# Patient Record
Sex: Male | Born: 1955 | Race: White | Hispanic: No | Marital: Married
Health system: Southern US, Community
[De-identification: ages and names within clinical notes are randomized; demographics above are authoritative.]

---

## 2007-01-26 ENCOUNTER — Encounter: Admission: RE | Admit: 2007-01-26 | Discharge: 2007-01-26 | Payer: Self-pay | Admitting: Family Medicine

## 2008-08-28 IMAGING — CR DG LUMBAR SPINE COMPLETE 4+V
5 series · 5 of 5 positions shown · non-contrast
Comparison: none

CLINICAL DATA: Pre-employment.  Asymptomatic.
DIAGNOSTIC LUMBAR SPINE COMPLETE - 4 VIEW: 
No comparison.

[t l-spine a.p.]
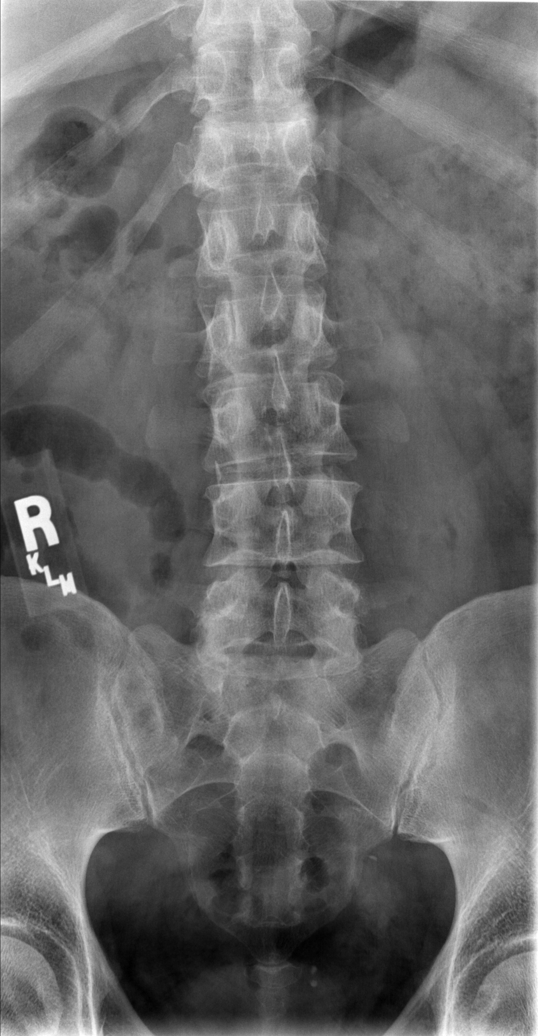

[t l-spine oblique exposure (1 of 2)]
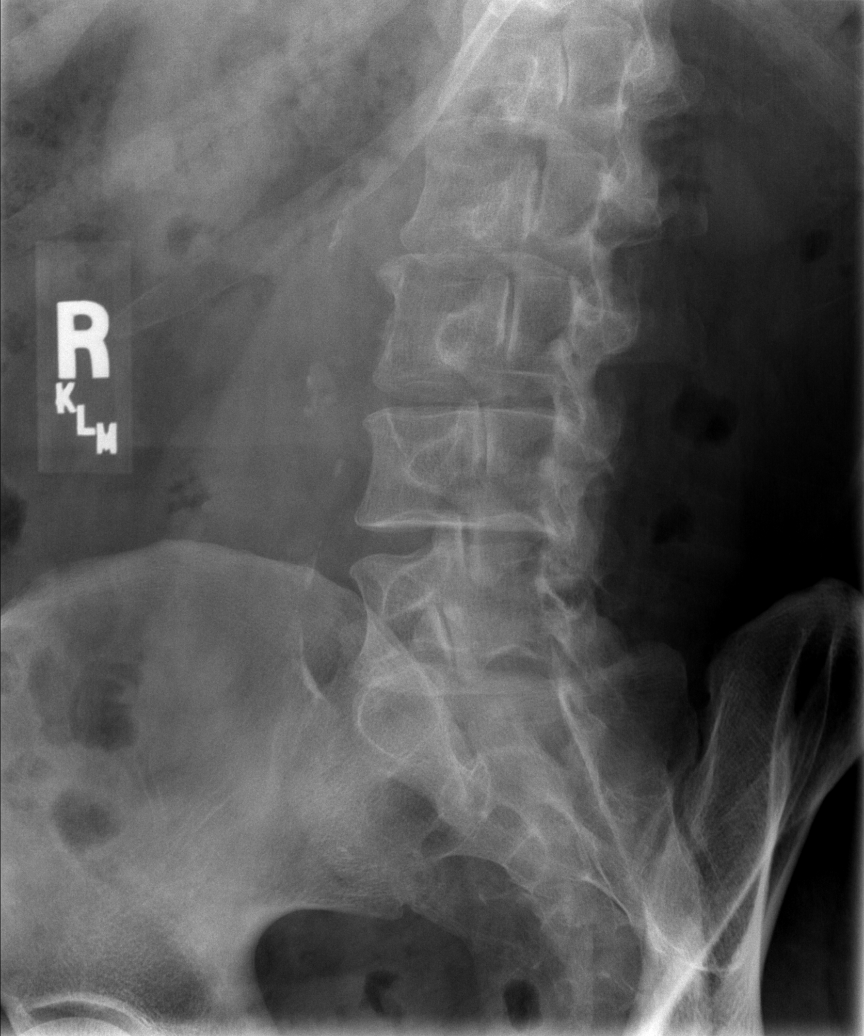

[t l-spine oblique exposure (2 of 2)]
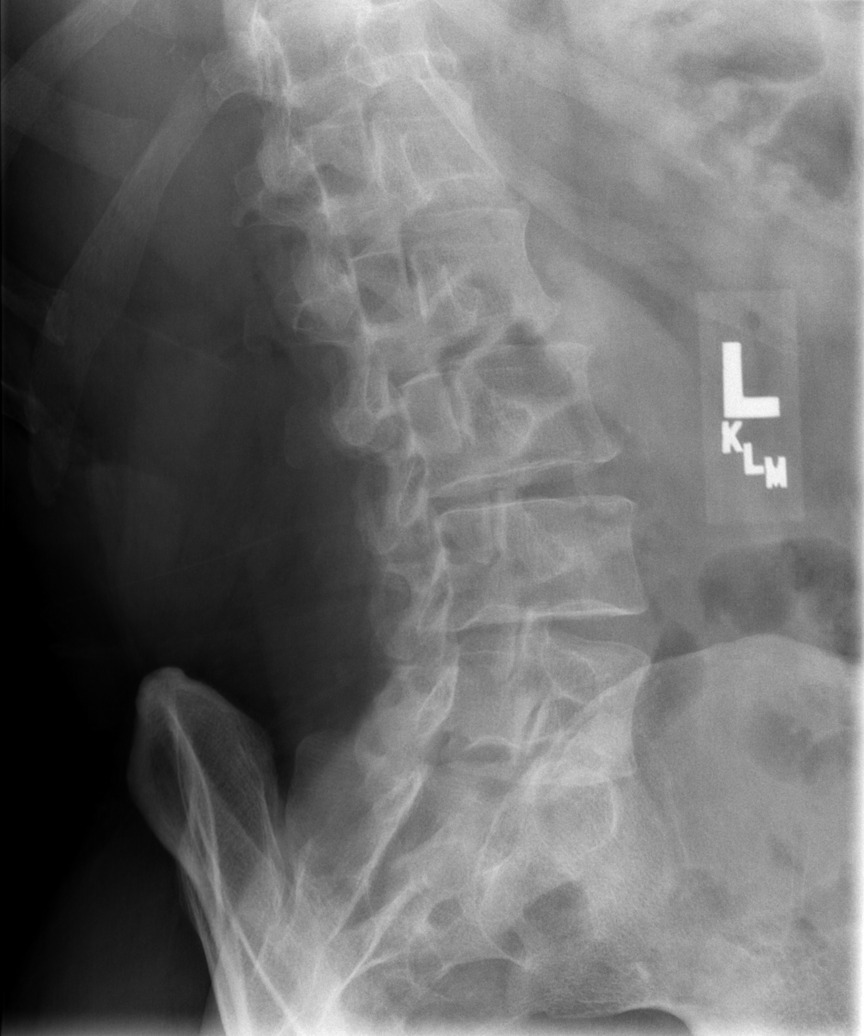

[t l-spine lat]
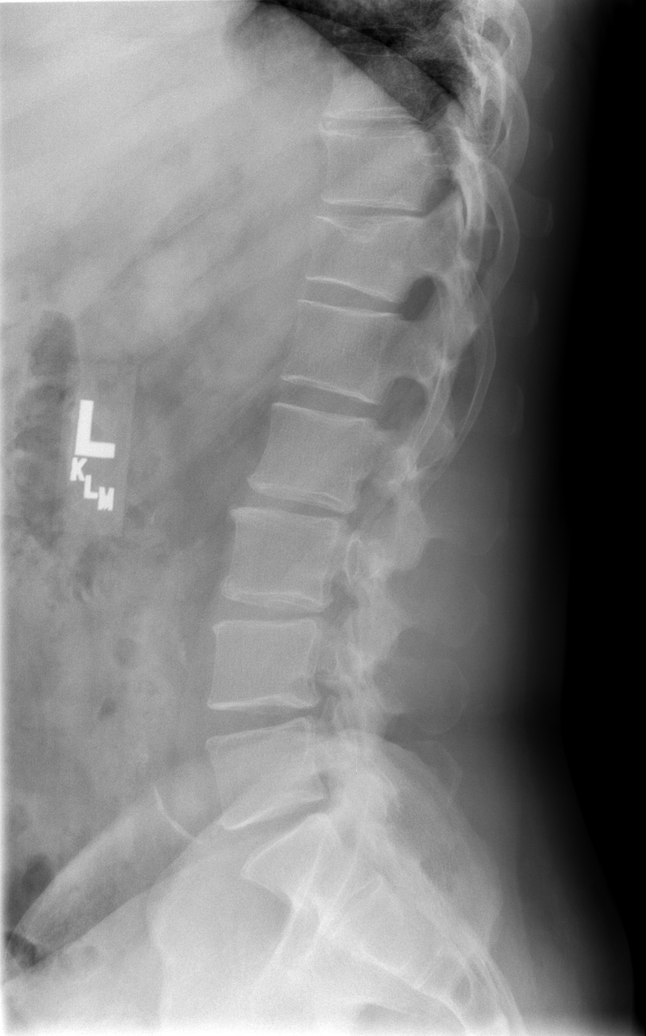

[t l-spine l5-s1 spot]
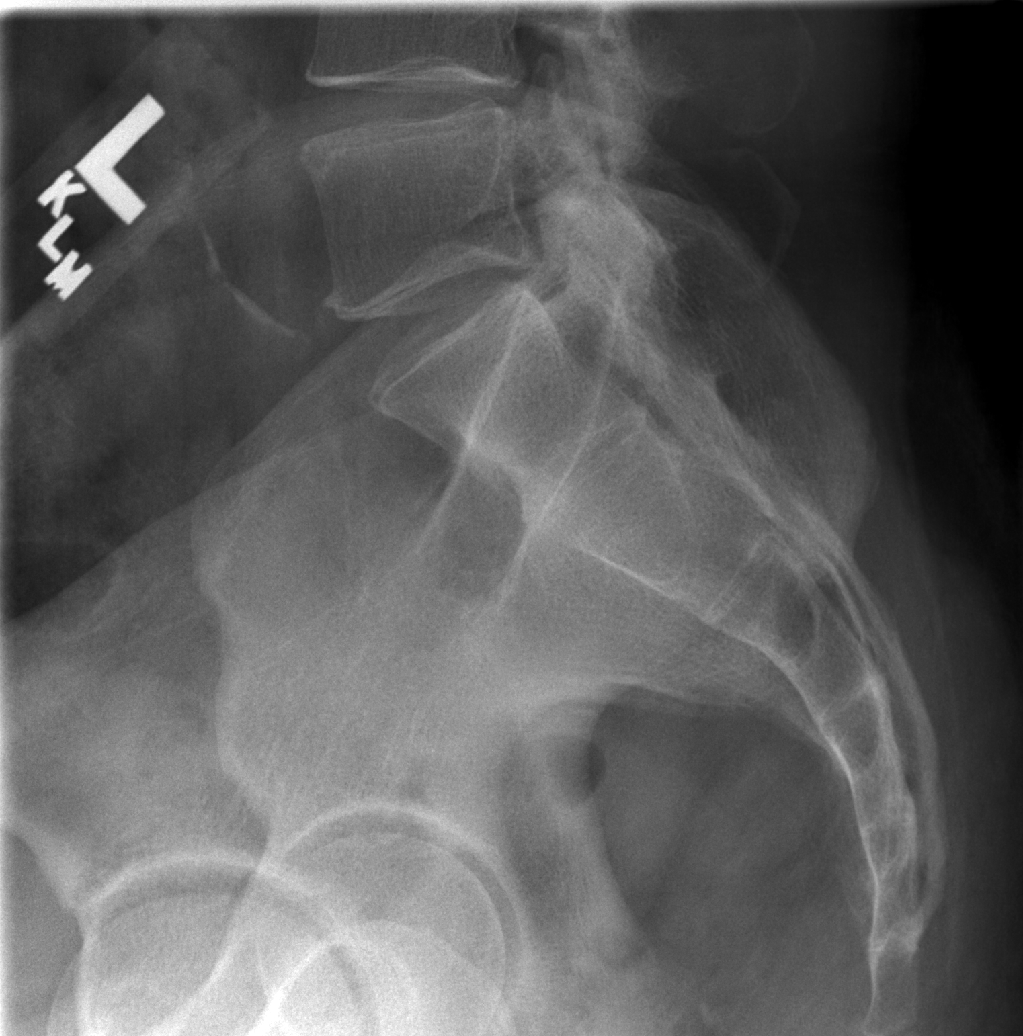

[5 of 5 positions shown; findings below may reference images not displayed]

FINDINGS: Five non-rib bearing lumbar vertebra are noted.   Chronic appearing slight wedging T12 with superior endplate Schmorl?s node noted.  Slight degenerative disk space narrowing and anterior spondylosis noted at T10-T11 with slight degenerative disk disease at L2-3.  The remaining disk spaces and posterior vertebral alignment are normally maintained.  Slight atheromatous vascular calcification noted.
IMPRESSION: 1.   Slight degenerative changes T10-T11 and L2-3. 
2.  Slight vascular calcification.
3.  Otherwise no significant abnormality.

## 2013-12-06 ENCOUNTER — Other Ambulatory Visit: Payer: Self-pay | Admitting: Family Medicine

## 2013-12-06 DIAGNOSIS — N644 Mastodynia: Secondary | ICD-10-CM

## 2013-12-13 ENCOUNTER — Encounter (INDEPENDENT_AMBULATORY_CARE_PROVIDER_SITE_OTHER): Payer: Self-pay

## 2013-12-13 ENCOUNTER — Ambulatory Visit
Admission: RE | Admit: 2013-12-13 | Discharge: 2013-12-13 | Disposition: A | Discharge status: Home / Self Care | Payer: Self-pay | Source: Ambulatory Visit | Attending: Family Medicine | Admitting: Family Medicine

## 2013-12-13 ENCOUNTER — Other Ambulatory Visit: Payer: Self-pay

## 2013-12-13 DIAGNOSIS — N644 Mastodynia: Secondary | ICD-10-CM

## 2015-08-21 DIAGNOSIS — K219 Gastro-esophageal reflux disease without esophagitis: Secondary | ICD-10-CM | POA: Diagnosis not present

## 2015-08-21 DIAGNOSIS — Z6835 Body mass index (BMI) 35.0-35.9, adult: Secondary | ICD-10-CM | POA: Diagnosis not present

## 2015-08-21 DIAGNOSIS — J301 Allergic rhinitis due to pollen: Secondary | ICD-10-CM | POA: Diagnosis not present

## 2015-08-21 DIAGNOSIS — G4733 Obstructive sleep apnea (adult) (pediatric): Secondary | ICD-10-CM | POA: Diagnosis not present

## 2015-10-16 DIAGNOSIS — R0989 Other specified symptoms and signs involving the circulatory and respiratory systems: Secondary | ICD-10-CM | POA: Diagnosis not present

## 2015-10-16 DIAGNOSIS — G4733 Obstructive sleep apnea (adult) (pediatric): Secondary | ICD-10-CM | POA: Diagnosis not present

## 2015-10-16 DIAGNOSIS — K219 Gastro-esophageal reflux disease without esophagitis: Secondary | ICD-10-CM | POA: Diagnosis not present

## 2015-10-16 DIAGNOSIS — J301 Allergic rhinitis due to pollen: Secondary | ICD-10-CM | POA: Diagnosis not present

## 2015-11-12 DIAGNOSIS — G4733 Obstructive sleep apnea (adult) (pediatric): Secondary | ICD-10-CM | POA: Diagnosis not present

## 2015-12-07 DIAGNOSIS — F3342 Major depressive disorder, recurrent, in full remission: Secondary | ICD-10-CM | POA: Diagnosis not present

## 2015-12-07 DIAGNOSIS — F900 Attention-deficit hyperactivity disorder, predominantly inattentive type: Secondary | ICD-10-CM | POA: Diagnosis not present

## 2016-01-07 DIAGNOSIS — Z85828 Personal history of other malignant neoplasm of skin: Secondary | ICD-10-CM | POA: Diagnosis not present

## 2016-01-07 DIAGNOSIS — Z08 Encounter for follow-up examination after completed treatment for malignant neoplasm: Secondary | ICD-10-CM | POA: Diagnosis not present

## 2016-01-07 DIAGNOSIS — L570 Actinic keratosis: Secondary | ICD-10-CM | POA: Diagnosis not present

## 2016-02-19 DIAGNOSIS — I10 Essential (primary) hypertension: Secondary | ICD-10-CM | POA: Diagnosis not present

## 2016-02-19 DIAGNOSIS — E7800 Pure hypercholesterolemia, unspecified: Secondary | ICD-10-CM | POA: Diagnosis not present

## 2016-02-19 DIAGNOSIS — Z23 Encounter for immunization: Secondary | ICD-10-CM | POA: Diagnosis not present

## 2016-02-19 DIAGNOSIS — E890 Postprocedural hypothyroidism: Secondary | ICD-10-CM | POA: Diagnosis not present

## 2016-02-19 DIAGNOSIS — M2550 Pain in unspecified joint: Secondary | ICD-10-CM | POA: Diagnosis not present

## 2016-02-19 DIAGNOSIS — F322 Major depressive disorder, single episode, severe without psychotic features: Secondary | ICD-10-CM | POA: Diagnosis not present

## 2016-02-19 DIAGNOSIS — R7301 Impaired fasting glucose: Secondary | ICD-10-CM | POA: Diagnosis not present

## 2016-04-04 DIAGNOSIS — K529 Noninfective gastroenteritis and colitis, unspecified: Secondary | ICD-10-CM | POA: Diagnosis not present

## 2016-05-19 DIAGNOSIS — R0989 Other specified symptoms and signs involving the circulatory and respiratory systems: Secondary | ICD-10-CM | POA: Diagnosis not present

## 2016-05-19 DIAGNOSIS — K219 Gastro-esophageal reflux disease without esophagitis: Secondary | ICD-10-CM | POA: Diagnosis not present

## 2016-05-19 DIAGNOSIS — G4733 Obstructive sleep apnea (adult) (pediatric): Secondary | ICD-10-CM | POA: Diagnosis not present

## 2016-05-19 DIAGNOSIS — Z9989 Dependence on other enabling machines and devices: Secondary | ICD-10-CM | POA: Diagnosis not present

## 2016-05-30 DIAGNOSIS — F3342 Major depressive disorder, recurrent, in full remission: Secondary | ICD-10-CM | POA: Diagnosis not present

## 2016-05-30 DIAGNOSIS — F900 Attention-deficit hyperactivity disorder, predominantly inattentive type: Secondary | ICD-10-CM | POA: Diagnosis not present

## 2016-09-25 DIAGNOSIS — Z0000 Encounter for general adult medical examination without abnormal findings: Secondary | ICD-10-CM | POA: Diagnosis not present

## 2016-09-25 DIAGNOSIS — E890 Postprocedural hypothyroidism: Secondary | ICD-10-CM | POA: Diagnosis not present

## 2016-09-25 DIAGNOSIS — Z125 Encounter for screening for malignant neoplasm of prostate: Secondary | ICD-10-CM | POA: Diagnosis not present

## 2016-09-25 DIAGNOSIS — E7800 Pure hypercholesterolemia, unspecified: Secondary | ICD-10-CM | POA: Diagnosis not present

## 2016-09-25 DIAGNOSIS — M109 Gout, unspecified: Secondary | ICD-10-CM | POA: Diagnosis not present

## 2016-09-25 DIAGNOSIS — I10 Essential (primary) hypertension: Secondary | ICD-10-CM | POA: Diagnosis not present

## 2016-09-27 DIAGNOSIS — J0100 Acute maxillary sinusitis, unspecified: Secondary | ICD-10-CM | POA: Diagnosis not present

## 2016-10-17 DIAGNOSIS — K6289 Other specified diseases of anus and rectum: Secondary | ICD-10-CM | POA: Diagnosis not present

## 2016-10-17 DIAGNOSIS — K5730 Diverticulosis of large intestine without perforation or abscess without bleeding: Secondary | ICD-10-CM | POA: Diagnosis not present

## 2016-10-17 DIAGNOSIS — Z1211 Encounter for screening for malignant neoplasm of colon: Secondary | ICD-10-CM | POA: Diagnosis not present

## 2016-10-17 DIAGNOSIS — K621 Rectal polyp: Secondary | ICD-10-CM | POA: Diagnosis not present

## 2016-10-17 DIAGNOSIS — Z8371 Family history of colonic polyps: Secondary | ICD-10-CM | POA: Diagnosis not present

## 2016-10-17 DIAGNOSIS — K640 First degree hemorrhoids: Secondary | ICD-10-CM | POA: Diagnosis not present

## 2016-10-21 DIAGNOSIS — Z1211 Encounter for screening for malignant neoplasm of colon: Secondary | ICD-10-CM | POA: Diagnosis not present

## 2016-10-21 DIAGNOSIS — K621 Rectal polyp: Secondary | ICD-10-CM | POA: Diagnosis not present

## 2016-11-21 DIAGNOSIS — F3342 Major depressive disorder, recurrent, in full remission: Secondary | ICD-10-CM | POA: Diagnosis not present

## 2016-11-21 DIAGNOSIS — F900 Attention-deficit hyperactivity disorder, predominantly inattentive type: Secondary | ICD-10-CM | POA: Diagnosis not present

## 2017-01-07 DIAGNOSIS — Z85828 Personal history of other malignant neoplasm of skin: Secondary | ICD-10-CM | POA: Diagnosis not present

## 2017-01-07 DIAGNOSIS — L570 Actinic keratosis: Secondary | ICD-10-CM | POA: Diagnosis not present

## 2017-01-07 DIAGNOSIS — Z08 Encounter for follow-up examination after completed treatment for malignant neoplasm: Secondary | ICD-10-CM | POA: Diagnosis not present

## 2017-03-30 DIAGNOSIS — F909 Attention-deficit hyperactivity disorder, unspecified type: Secondary | ICD-10-CM | POA: Diagnosis not present

## 2017-03-30 DIAGNOSIS — I10 Essential (primary) hypertension: Secondary | ICD-10-CM | POA: Diagnosis not present

## 2017-03-30 DIAGNOSIS — E890 Postprocedural hypothyroidism: Secondary | ICD-10-CM | POA: Diagnosis not present

## 2017-03-30 DIAGNOSIS — E7800 Pure hypercholesterolemia, unspecified: Secondary | ICD-10-CM | POA: Diagnosis not present

## 2017-03-30 DIAGNOSIS — F322 Major depressive disorder, single episode, severe without psychotic features: Secondary | ICD-10-CM | POA: Diagnosis not present

## 2017-05-14 DIAGNOSIS — Z79899 Other long term (current) drug therapy: Secondary | ICD-10-CM | POA: Diagnosis not present

## 2017-05-14 DIAGNOSIS — F419 Anxiety disorder, unspecified: Secondary | ICD-10-CM | POA: Diagnosis not present

## 2017-05-14 DIAGNOSIS — R41840 Attention and concentration deficit: Secondary | ICD-10-CM | POA: Diagnosis not present

## 2017-05-14 DIAGNOSIS — F902 Attention-deficit hyperactivity disorder, combined type: Secondary | ICD-10-CM | POA: Diagnosis not present

## 2017-05-27 DIAGNOSIS — J0140 Acute pansinusitis, unspecified: Secondary | ICD-10-CM | POA: Diagnosis not present

## 2017-07-08 DIAGNOSIS — H527 Unspecified disorder of refraction: Secondary | ICD-10-CM | POA: Diagnosis not present

## 2017-07-08 DIAGNOSIS — H25813 Combined forms of age-related cataract, bilateral: Secondary | ICD-10-CM | POA: Diagnosis not present

## 2017-07-08 DIAGNOSIS — H43813 Vitreous degeneration, bilateral: Secondary | ICD-10-CM | POA: Diagnosis not present

## 2017-07-08 DIAGNOSIS — H40003 Preglaucoma, unspecified, bilateral: Secondary | ICD-10-CM | POA: Diagnosis not present

## 2017-07-08 DIAGNOSIS — H52222 Regular astigmatism, left eye: Secondary | ICD-10-CM | POA: Diagnosis not present

## 2017-07-15 DIAGNOSIS — R197 Diarrhea, unspecified: Secondary | ICD-10-CM | POA: Diagnosis not present

## 2017-07-16 DIAGNOSIS — R197 Diarrhea, unspecified: Secondary | ICD-10-CM | POA: Diagnosis not present

## 2017-07-30 DIAGNOSIS — H25813 Combined forms of age-related cataract, bilateral: Secondary | ICD-10-CM | POA: Diagnosis not present

## 2017-07-30 DIAGNOSIS — H52222 Regular astigmatism, left eye: Secondary | ICD-10-CM | POA: Diagnosis not present

## 2017-08-02 DIAGNOSIS — M79671 Pain in right foot: Secondary | ICD-10-CM | POA: Diagnosis not present

## 2017-08-06 DIAGNOSIS — E039 Hypothyroidism, unspecified: Secondary | ICD-10-CM | POA: Diagnosis not present

## 2017-08-06 DIAGNOSIS — Z79899 Other long term (current) drug therapy: Secondary | ICD-10-CM | POA: Diagnosis not present

## 2017-08-06 DIAGNOSIS — H40003 Preglaucoma, unspecified, bilateral: Secondary | ICD-10-CM | POA: Diagnosis not present

## 2017-08-06 DIAGNOSIS — H43813 Vitreous degeneration, bilateral: Secondary | ICD-10-CM | POA: Diagnosis not present

## 2017-08-06 DIAGNOSIS — F339 Major depressive disorder, recurrent, unspecified: Secondary | ICD-10-CM | POA: Diagnosis not present

## 2017-08-06 DIAGNOSIS — H25813 Combined forms of age-related cataract, bilateral: Secondary | ICD-10-CM | POA: Diagnosis not present

## 2017-08-06 DIAGNOSIS — H25812 Combined forms of age-related cataract, left eye: Secondary | ICD-10-CM | POA: Diagnosis not present

## 2017-08-06 DIAGNOSIS — Z881 Allergy status to other antibiotic agents status: Secondary | ICD-10-CM | POA: Diagnosis not present

## 2017-08-06 DIAGNOSIS — K219 Gastro-esophageal reflux disease without esophagitis: Secondary | ICD-10-CM | POA: Diagnosis not present

## 2017-08-06 DIAGNOSIS — H52222 Regular astigmatism, left eye: Secondary | ICD-10-CM | POA: Diagnosis not present

## 2017-08-06 DIAGNOSIS — F909 Attention-deficit hyperactivity disorder, unspecified type: Secondary | ICD-10-CM | POA: Diagnosis not present

## 2017-08-06 DIAGNOSIS — I10 Essential (primary) hypertension: Secondary | ICD-10-CM | POA: Diagnosis not present

## 2017-08-06 DIAGNOSIS — Z87891 Personal history of nicotine dependence: Secondary | ICD-10-CM | POA: Diagnosis not present

## 2017-08-06 DIAGNOSIS — H527 Unspecified disorder of refraction: Secondary | ICD-10-CM | POA: Diagnosis not present

## 2017-08-06 DIAGNOSIS — E785 Hyperlipidemia, unspecified: Secondary | ICD-10-CM | POA: Diagnosis not present

## 2017-08-07 DIAGNOSIS — H25811 Combined forms of age-related cataract, right eye: Secondary | ICD-10-CM | POA: Diagnosis not present

## 2017-08-07 DIAGNOSIS — H2512 Age-related nuclear cataract, left eye: Secondary | ICD-10-CM | POA: Diagnosis not present

## 2017-08-07 DIAGNOSIS — H40003 Preglaucoma, unspecified, bilateral: Secondary | ICD-10-CM | POA: Diagnosis not present

## 2017-08-07 DIAGNOSIS — Z961 Presence of intraocular lens: Secondary | ICD-10-CM | POA: Diagnosis not present

## 2017-08-07 DIAGNOSIS — H43813 Vitreous degeneration, bilateral: Secondary | ICD-10-CM | POA: Diagnosis not present

## 2017-08-13 DIAGNOSIS — F900 Attention-deficit hyperactivity disorder, predominantly inattentive type: Secondary | ICD-10-CM | POA: Diagnosis not present

## 2017-08-17 DIAGNOSIS — R51 Headache: Secondary | ICD-10-CM | POA: Diagnosis not present

## 2017-08-17 DIAGNOSIS — J309 Allergic rhinitis, unspecified: Secondary | ICD-10-CM | POA: Diagnosis not present

## 2017-09-16 DIAGNOSIS — F419 Anxiety disorder, unspecified: Secondary | ICD-10-CM | POA: Diagnosis not present

## 2017-09-29 DIAGNOSIS — E890 Postprocedural hypothyroidism: Secondary | ICD-10-CM | POA: Diagnosis not present

## 2017-09-29 DIAGNOSIS — Z0000 Encounter for general adult medical examination without abnormal findings: Secondary | ICD-10-CM | POA: Diagnosis not present

## 2017-09-29 DIAGNOSIS — F322 Major depressive disorder, single episode, severe without psychotic features: Secondary | ICD-10-CM | POA: Diagnosis not present

## 2017-09-29 DIAGNOSIS — I10 Essential (primary) hypertension: Secondary | ICD-10-CM | POA: Diagnosis not present

## 2017-09-29 DIAGNOSIS — R7301 Impaired fasting glucose: Secondary | ICD-10-CM | POA: Diagnosis not present

## 2017-09-29 DIAGNOSIS — E7800 Pure hypercholesterolemia, unspecified: Secondary | ICD-10-CM | POA: Diagnosis not present

## 2017-10-09 DIAGNOSIS — F419 Anxiety disorder, unspecified: Secondary | ICD-10-CM | POA: Diagnosis not present

## 2017-10-30 DIAGNOSIS — F419 Anxiety disorder, unspecified: Secondary | ICD-10-CM | POA: Diagnosis not present

## 2018-02-16 DIAGNOSIS — B9689 Other specified bacterial agents as the cause of diseases classified elsewhere: Secondary | ICD-10-CM | POA: Diagnosis not present

## 2018-02-16 DIAGNOSIS — J019 Acute sinusitis, unspecified: Secondary | ICD-10-CM | POA: Diagnosis not present

## 2018-02-19 DIAGNOSIS — F3342 Major depressive disorder, recurrent, in full remission: Secondary | ICD-10-CM | POA: Diagnosis not present

## 2018-02-19 DIAGNOSIS — F900 Attention-deficit hyperactivity disorder, predominantly inattentive type: Secondary | ICD-10-CM | POA: Diagnosis not present

## 2018-02-19 DIAGNOSIS — F4322 Adjustment disorder with anxiety: Secondary | ICD-10-CM | POA: Diagnosis not present

## 2018-04-01 DIAGNOSIS — F322 Major depressive disorder, single episode, severe without psychotic features: Secondary | ICD-10-CM | POA: Diagnosis not present

## 2018-04-01 DIAGNOSIS — I10 Essential (primary) hypertension: Secondary | ICD-10-CM | POA: Diagnosis not present

## 2018-04-01 DIAGNOSIS — R7301 Impaired fasting glucose: Secondary | ICD-10-CM | POA: Diagnosis not present

## 2018-04-01 DIAGNOSIS — F909 Attention-deficit hyperactivity disorder, unspecified type: Secondary | ICD-10-CM | POA: Diagnosis not present

## 2018-04-01 DIAGNOSIS — E7800 Pure hypercholesterolemia, unspecified: Secondary | ICD-10-CM | POA: Diagnosis not present

## 2018-04-01 DIAGNOSIS — E890 Postprocedural hypothyroidism: Secondary | ICD-10-CM | POA: Diagnosis not present

## 2018-04-13 DIAGNOSIS — G4733 Obstructive sleep apnea (adult) (pediatric): Secondary | ICD-10-CM | POA: Diagnosis not present

## 2018-05-04 DIAGNOSIS — J019 Acute sinusitis, unspecified: Secondary | ICD-10-CM | POA: Diagnosis not present

## 2018-08-13 DIAGNOSIS — F900 Attention-deficit hyperactivity disorder, predominantly inattentive type: Secondary | ICD-10-CM | POA: Diagnosis not present

## 2018-08-19 DIAGNOSIS — G4733 Obstructive sleep apnea (adult) (pediatric): Secondary | ICD-10-CM | POA: Diagnosis not present

## 2018-11-09 DIAGNOSIS — Z125 Encounter for screening for malignant neoplasm of prostate: Secondary | ICD-10-CM | POA: Diagnosis not present

## 2018-11-09 DIAGNOSIS — Z8249 Family history of ischemic heart disease and other diseases of the circulatory system: Secondary | ICD-10-CM | POA: Diagnosis not present

## 2018-11-09 DIAGNOSIS — Z6836 Body mass index (BMI) 36.0-36.9, adult: Secondary | ICD-10-CM | POA: Diagnosis not present

## 2018-11-09 DIAGNOSIS — R7301 Impaired fasting glucose: Secondary | ICD-10-CM | POA: Diagnosis not present

## 2018-11-09 DIAGNOSIS — E7800 Pure hypercholesterolemia, unspecified: Secondary | ICD-10-CM | POA: Diagnosis not present

## 2018-11-09 DIAGNOSIS — Z0000 Encounter for general adult medical examination without abnormal findings: Secondary | ICD-10-CM | POA: Diagnosis not present

## 2018-11-09 DIAGNOSIS — I10 Essential (primary) hypertension: Secondary | ICD-10-CM | POA: Diagnosis not present

## 2018-11-09 DIAGNOSIS — E890 Postprocedural hypothyroidism: Secondary | ICD-10-CM | POA: Diagnosis not present

## 2019-03-11 DIAGNOSIS — F900 Attention-deficit hyperactivity disorder, predominantly inattentive type: Secondary | ICD-10-CM | POA: Diagnosis not present

## 2019-03-11 DIAGNOSIS — F411 Generalized anxiety disorder: Secondary | ICD-10-CM | POA: Diagnosis not present

## 2019-03-17 DIAGNOSIS — G5601 Carpal tunnel syndrome, right upper limb: Secondary | ICD-10-CM | POA: Diagnosis not present

## 2019-03-17 DIAGNOSIS — G5602 Carpal tunnel syndrome, left upper limb: Secondary | ICD-10-CM | POA: Diagnosis not present

## 2019-04-19 DIAGNOSIS — G5601 Carpal tunnel syndrome, right upper limb: Secondary | ICD-10-CM | POA: Diagnosis not present

## 2019-04-19 DIAGNOSIS — G5602 Carpal tunnel syndrome, left upper limb: Secondary | ICD-10-CM | POA: Diagnosis not present

## 2019-04-19 DIAGNOSIS — M1812 Unilateral primary osteoarthritis of first carpometacarpal joint, left hand: Secondary | ICD-10-CM | POA: Diagnosis not present

## 2019-05-11 DIAGNOSIS — F322 Major depressive disorder, single episode, severe without psychotic features: Secondary | ICD-10-CM | POA: Diagnosis not present

## 2019-05-11 DIAGNOSIS — I10 Essential (primary) hypertension: Secondary | ICD-10-CM | POA: Diagnosis not present

## 2019-05-11 DIAGNOSIS — F909 Attention-deficit hyperactivity disorder, unspecified type: Secondary | ICD-10-CM | POA: Diagnosis not present

## 2019-05-11 DIAGNOSIS — E7800 Pure hypercholesterolemia, unspecified: Secondary | ICD-10-CM | POA: Diagnosis not present

## 2019-05-24 DIAGNOSIS — M25561 Pain in right knee: Secondary | ICD-10-CM | POA: Diagnosis not present

## 2019-05-24 DIAGNOSIS — G5601 Carpal tunnel syndrome, right upper limb: Secondary | ICD-10-CM | POA: Diagnosis not present

## 2019-05-24 DIAGNOSIS — G5602 Carpal tunnel syndrome, left upper limb: Secondary | ICD-10-CM | POA: Diagnosis not present

## 2019-05-24 DIAGNOSIS — M66261 Spontaneous rupture of extensor tendons, right lower leg: Secondary | ICD-10-CM | POA: Diagnosis not present

## 2019-06-08 DIAGNOSIS — G5602 Carpal tunnel syndrome, left upper limb: Secondary | ICD-10-CM | POA: Diagnosis not present

## 2019-06-10 DIAGNOSIS — G5602 Carpal tunnel syndrome, left upper limb: Secondary | ICD-10-CM | POA: Diagnosis not present

## 2019-06-21 DIAGNOSIS — G5602 Carpal tunnel syndrome, left upper limb: Secondary | ICD-10-CM | POA: Diagnosis not present

## 2019-07-20 DIAGNOSIS — L237 Allergic contact dermatitis due to plants, except food: Secondary | ICD-10-CM | POA: Diagnosis not present

## 2019-08-31 DIAGNOSIS — F900 Attention-deficit hyperactivity disorder, predominantly inattentive type: Secondary | ICD-10-CM | POA: Diagnosis not present

## 2019-09-02 DIAGNOSIS — J309 Allergic rhinitis, unspecified: Secondary | ICD-10-CM | POA: Diagnosis not present

## 2019-09-13 DIAGNOSIS — L918 Other hypertrophic disorders of the skin: Secondary | ICD-10-CM | POA: Diagnosis not present

## 2019-09-13 DIAGNOSIS — L570 Actinic keratosis: Secondary | ICD-10-CM | POA: Diagnosis not present

## 2019-09-13 DIAGNOSIS — Z85828 Personal history of other malignant neoplasm of skin: Secondary | ICD-10-CM | POA: Diagnosis not present

## 2019-09-13 DIAGNOSIS — L578 Other skin changes due to chronic exposure to nonionizing radiation: Secondary | ICD-10-CM | POA: Diagnosis not present

## 2019-12-08 DIAGNOSIS — I10 Essential (primary) hypertension: Secondary | ICD-10-CM | POA: Diagnosis not present

## 2019-12-08 DIAGNOSIS — E890 Postprocedural hypothyroidism: Secondary | ICD-10-CM | POA: Diagnosis not present

## 2019-12-08 DIAGNOSIS — Z0000 Encounter for general adult medical examination without abnormal findings: Secondary | ICD-10-CM | POA: Diagnosis not present

## 2019-12-08 DIAGNOSIS — E7800 Pure hypercholesterolemia, unspecified: Secondary | ICD-10-CM | POA: Diagnosis not present

## 2020-01-17 DIAGNOSIS — M25562 Pain in left knee: Secondary | ICD-10-CM | POA: Diagnosis not present

## 2020-02-23 DIAGNOSIS — F900 Attention-deficit hyperactivity disorder, predominantly inattentive type: Secondary | ICD-10-CM | POA: Diagnosis not present

## 2020-02-23 DIAGNOSIS — G4700 Insomnia, unspecified: Secondary | ICD-10-CM | POA: Diagnosis not present

## 2020-02-23 DIAGNOSIS — F3342 Major depressive disorder, recurrent, in full remission: Secondary | ICD-10-CM | POA: Diagnosis not present

## 2020-03-19 DIAGNOSIS — L259 Unspecified contact dermatitis, unspecified cause: Secondary | ICD-10-CM | POA: Diagnosis not present

## 2020-03-29 DIAGNOSIS — G4733 Obstructive sleep apnea (adult) (pediatric): Secondary | ICD-10-CM | POA: Diagnosis not present

## 2020-03-30 DIAGNOSIS — R5383 Other fatigue: Secondary | ICD-10-CM | POA: Diagnosis not present

## 2020-03-30 DIAGNOSIS — E890 Postprocedural hypothyroidism: Secondary | ICD-10-CM | POA: Diagnosis not present

## 2020-03-30 DIAGNOSIS — L259 Unspecified contact dermatitis, unspecified cause: Secondary | ICD-10-CM | POA: Diagnosis not present

## 2020-03-30 DIAGNOSIS — R197 Diarrhea, unspecified: Secondary | ICD-10-CM | POA: Diagnosis not present

## 2020-04-19 DIAGNOSIS — L0100 Impetigo, unspecified: Secondary | ICD-10-CM | POA: Diagnosis not present

## 2020-04-19 DIAGNOSIS — B029 Zoster without complications: Secondary | ICD-10-CM | POA: Diagnosis not present

## 2020-04-25 DIAGNOSIS — B029 Zoster without complications: Secondary | ICD-10-CM | POA: Diagnosis not present

## 2020-05-18 DIAGNOSIS — G4733 Obstructive sleep apnea (adult) (pediatric): Secondary | ICD-10-CM | POA: Diagnosis not present

## 2020-06-11 DIAGNOSIS — I10 Essential (primary) hypertension: Secondary | ICD-10-CM | POA: Diagnosis not present

## 2020-06-11 DIAGNOSIS — E890 Postprocedural hypothyroidism: Secondary | ICD-10-CM | POA: Diagnosis not present

## 2020-06-11 DIAGNOSIS — F322 Major depressive disorder, single episode, severe without psychotic features: Secondary | ICD-10-CM | POA: Diagnosis not present

## 2020-06-11 DIAGNOSIS — E7800 Pure hypercholesterolemia, unspecified: Secondary | ICD-10-CM | POA: Diagnosis not present

## 2020-06-18 DIAGNOSIS — G4733 Obstructive sleep apnea (adult) (pediatric): Secondary | ICD-10-CM | POA: Diagnosis not present

## 2020-06-25 DIAGNOSIS — G4733 Obstructive sleep apnea (adult) (pediatric): Secondary | ICD-10-CM | POA: Diagnosis not present

## 2020-07-16 DIAGNOSIS — G4733 Obstructive sleep apnea (adult) (pediatric): Secondary | ICD-10-CM | POA: Diagnosis not present

## 2020-08-06 DIAGNOSIS — J069 Acute upper respiratory infection, unspecified: Secondary | ICD-10-CM | POA: Diagnosis not present

## 2020-08-06 DIAGNOSIS — Z20822 Contact with and (suspected) exposure to covid-19: Secondary | ICD-10-CM | POA: Diagnosis not present

## 2020-08-16 DIAGNOSIS — G4733 Obstructive sleep apnea (adult) (pediatric): Secondary | ICD-10-CM | POA: Diagnosis not present

## 2020-09-03 DIAGNOSIS — F900 Attention-deficit hyperactivity disorder, predominantly inattentive type: Secondary | ICD-10-CM | POA: Diagnosis not present

## 2020-09-03 DIAGNOSIS — F4322 Adjustment disorder with anxiety: Secondary | ICD-10-CM | POA: Diagnosis not present

## 2020-09-11 DIAGNOSIS — L570 Actinic keratosis: Secondary | ICD-10-CM | POA: Diagnosis not present

## 2020-09-11 DIAGNOSIS — Z85828 Personal history of other malignant neoplasm of skin: Secondary | ICD-10-CM | POA: Diagnosis not present

## 2020-09-15 DIAGNOSIS — G4733 Obstructive sleep apnea (adult) (pediatric): Secondary | ICD-10-CM | POA: Diagnosis not present

## 2020-10-04 DIAGNOSIS — M1811 Unilateral primary osteoarthritis of first carpometacarpal joint, right hand: Secondary | ICD-10-CM | POA: Diagnosis not present

## 2020-10-04 DIAGNOSIS — G5601 Carpal tunnel syndrome, right upper limb: Secondary | ICD-10-CM | POA: Diagnosis not present

## 2020-10-16 DIAGNOSIS — G4733 Obstructive sleep apnea (adult) (pediatric): Secondary | ICD-10-CM | POA: Diagnosis not present

## 2020-11-15 DIAGNOSIS — G4733 Obstructive sleep apnea (adult) (pediatric): Secondary | ICD-10-CM | POA: Diagnosis not present

## 2020-12-05 DIAGNOSIS — Z20822 Contact with and (suspected) exposure to covid-19: Secondary | ICD-10-CM | POA: Diagnosis not present

## 2020-12-05 DIAGNOSIS — J329 Chronic sinusitis, unspecified: Secondary | ICD-10-CM | POA: Diagnosis not present

## 2020-12-05 DIAGNOSIS — R0982 Postnasal drip: Secondary | ICD-10-CM | POA: Diagnosis not present

## 2020-12-05 DIAGNOSIS — B9689 Other specified bacterial agents as the cause of diseases classified elsewhere: Secondary | ICD-10-CM | POA: Diagnosis not present

## 2020-12-16 DIAGNOSIS — G4733 Obstructive sleep apnea (adult) (pediatric): Secondary | ICD-10-CM | POA: Diagnosis not present

## 2021-01-16 DIAGNOSIS — G4733 Obstructive sleep apnea (adult) (pediatric): Secondary | ICD-10-CM | POA: Diagnosis not present

## 2021-05-16 DIAGNOSIS — M1711 Unilateral primary osteoarthritis, right knee: Secondary | ICD-10-CM | POA: Diagnosis not present

## 2021-05-22 DIAGNOSIS — B9689 Other specified bacterial agents as the cause of diseases classified elsewhere: Secondary | ICD-10-CM | POA: Diagnosis not present

## 2021-05-22 DIAGNOSIS — J019 Acute sinusitis, unspecified: Secondary | ICD-10-CM | POA: Diagnosis not present

## 2021-06-17 DIAGNOSIS — J069 Acute upper respiratory infection, unspecified: Secondary | ICD-10-CM | POA: Diagnosis not present

## 2021-06-17 DIAGNOSIS — Z20822 Contact with and (suspected) exposure to covid-19: Secondary | ICD-10-CM | POA: Diagnosis not present

## 2021-06-20 DIAGNOSIS — M1711 Unilateral primary osteoarthritis, right knee: Secondary | ICD-10-CM | POA: Diagnosis not present

## 2021-07-08 DIAGNOSIS — G4733 Obstructive sleep apnea (adult) (pediatric): Secondary | ICD-10-CM | POA: Diagnosis not present

## 2021-08-01 DIAGNOSIS — E7800 Pure hypercholesterolemia, unspecified: Secondary | ICD-10-CM | POA: Diagnosis not present

## 2021-08-01 DIAGNOSIS — M25561 Pain in right knee: Secondary | ICD-10-CM | POA: Diagnosis not present

## 2021-08-01 DIAGNOSIS — R7301 Impaired fasting glucose: Secondary | ICD-10-CM | POA: Diagnosis not present

## 2021-08-01 DIAGNOSIS — I10 Essential (primary) hypertension: Secondary | ICD-10-CM | POA: Diagnosis not present

## 2021-08-01 DIAGNOSIS — E890 Postprocedural hypothyroidism: Secondary | ICD-10-CM | POA: Diagnosis not present

## 2021-08-01 DIAGNOSIS — G4730 Sleep apnea, unspecified: Secondary | ICD-10-CM | POA: Diagnosis not present

## 2021-08-06 DIAGNOSIS — M25561 Pain in right knee: Secondary | ICD-10-CM | POA: Diagnosis not present

## 2021-08-06 DIAGNOSIS — M1711 Unilateral primary osteoarthritis, right knee: Secondary | ICD-10-CM | POA: Diagnosis not present

## 2021-09-02 DIAGNOSIS — M94261 Chondromalacia, right knee: Secondary | ICD-10-CM | POA: Diagnosis not present

## 2021-09-02 DIAGNOSIS — M65861 Other synovitis and tenosynovitis, right lower leg: Secondary | ICD-10-CM | POA: Diagnosis not present

## 2021-09-02 DIAGNOSIS — M2241 Chondromalacia patellae, right knee: Secondary | ICD-10-CM | POA: Diagnosis not present

## 2021-09-02 DIAGNOSIS — S83231A Complex tear of medial meniscus, current injury, right knee, initial encounter: Secondary | ICD-10-CM | POA: Diagnosis not present

## 2021-09-02 DIAGNOSIS — M25461 Effusion, right knee: Secondary | ICD-10-CM | POA: Diagnosis not present

## 2021-09-02 DIAGNOSIS — S83241A Other tear of medial meniscus, current injury, right knee, initial encounter: Secondary | ICD-10-CM | POA: Diagnosis not present

## 2021-09-02 DIAGNOSIS — R936 Abnormal findings on diagnostic imaging of limbs: Secondary | ICD-10-CM | POA: Diagnosis not present

## 2021-09-02 DIAGNOSIS — M659 Synovitis and tenosynovitis, unspecified: Secondary | ICD-10-CM | POA: Diagnosis not present

## 2021-09-05 DIAGNOSIS — M1711 Unilateral primary osteoarthritis, right knee: Secondary | ICD-10-CM | POA: Diagnosis not present

## 2021-09-12 DIAGNOSIS — Z85828 Personal history of other malignant neoplasm of skin: Secondary | ICD-10-CM | POA: Diagnosis not present

## 2021-09-12 DIAGNOSIS — Z129 Encounter for screening for malignant neoplasm, site unspecified: Secondary | ICD-10-CM | POA: Diagnosis not present

## 2021-09-12 DIAGNOSIS — Z01810 Encounter for preprocedural cardiovascular examination: Secondary | ICD-10-CM | POA: Diagnosis not present

## 2021-09-12 DIAGNOSIS — L570 Actinic keratosis: Secondary | ICD-10-CM | POA: Diagnosis not present

## 2021-09-13 DIAGNOSIS — B9689 Other specified bacterial agents as the cause of diseases classified elsewhere: Secondary | ICD-10-CM | POA: Diagnosis not present

## 2021-09-13 DIAGNOSIS — J3489 Other specified disorders of nose and nasal sinuses: Secondary | ICD-10-CM | POA: Diagnosis not present

## 2021-09-13 DIAGNOSIS — R0981 Nasal congestion: Secondary | ICD-10-CM | POA: Diagnosis not present

## 2021-09-13 DIAGNOSIS — R519 Headache, unspecified: Secondary | ICD-10-CM | POA: Diagnosis not present

## 2021-09-25 DIAGNOSIS — M1711 Unilateral primary osteoarthritis, right knee: Secondary | ICD-10-CM | POA: Diagnosis not present

## 2021-10-04 DIAGNOSIS — Z96651 Presence of right artificial knee joint: Secondary | ICD-10-CM | POA: Diagnosis not present

## 2021-10-04 DIAGNOSIS — M21161 Varus deformity, not elsewhere classified, right knee: Secondary | ICD-10-CM | POA: Diagnosis not present

## 2021-10-04 DIAGNOSIS — G8918 Other acute postprocedural pain: Secondary | ICD-10-CM | POA: Diagnosis not present

## 2021-10-04 DIAGNOSIS — M1711 Unilateral primary osteoarthritis, right knee: Secondary | ICD-10-CM | POA: Diagnosis not present

## 2021-10-04 DIAGNOSIS — M25761 Osteophyte, right knee: Secondary | ICD-10-CM | POA: Diagnosis not present

## 2021-10-08 DIAGNOSIS — M1711 Unilateral primary osteoarthritis, right knee: Secondary | ICD-10-CM | POA: Diagnosis not present

## 2021-10-08 DIAGNOSIS — Z96651 Presence of right artificial knee joint: Secondary | ICD-10-CM | POA: Diagnosis not present

## 2021-10-08 DIAGNOSIS — R600 Localized edema: Secondary | ICD-10-CM | POA: Diagnosis not present

## 2021-10-08 DIAGNOSIS — R2689 Other abnormalities of gait and mobility: Secondary | ICD-10-CM | POA: Diagnosis not present

## 2021-10-08 DIAGNOSIS — Z471 Aftercare following joint replacement surgery: Secondary | ICD-10-CM | POA: Diagnosis not present

## 2021-10-08 DIAGNOSIS — M25561 Pain in right knee: Secondary | ICD-10-CM | POA: Diagnosis not present

## 2021-10-08 DIAGNOSIS — G8929 Other chronic pain: Secondary | ICD-10-CM | POA: Diagnosis not present

## 2021-10-08 DIAGNOSIS — R6889 Other general symptoms and signs: Secondary | ICD-10-CM | POA: Diagnosis not present

## 2021-10-11 DIAGNOSIS — Z471 Aftercare following joint replacement surgery: Secondary | ICD-10-CM | POA: Diagnosis not present

## 2021-10-11 DIAGNOSIS — R600 Localized edema: Secondary | ICD-10-CM | POA: Diagnosis not present

## 2021-10-11 DIAGNOSIS — R6889 Other general symptoms and signs: Secondary | ICD-10-CM | POA: Diagnosis not present

## 2021-10-11 DIAGNOSIS — R2689 Other abnormalities of gait and mobility: Secondary | ICD-10-CM | POA: Diagnosis not present

## 2021-10-11 DIAGNOSIS — M1711 Unilateral primary osteoarthritis, right knee: Secondary | ICD-10-CM | POA: Diagnosis not present

## 2021-10-11 DIAGNOSIS — Z96651 Presence of right artificial knee joint: Secondary | ICD-10-CM | POA: Diagnosis not present

## 2021-10-11 DIAGNOSIS — M25561 Pain in right knee: Secondary | ICD-10-CM | POA: Diagnosis not present

## 2021-10-11 DIAGNOSIS — G8929 Other chronic pain: Secondary | ICD-10-CM | POA: Diagnosis not present

## 2021-10-15 DIAGNOSIS — M25561 Pain in right knee: Secondary | ICD-10-CM | POA: Diagnosis not present

## 2021-10-15 DIAGNOSIS — Z96651 Presence of right artificial knee joint: Secondary | ICD-10-CM | POA: Diagnosis not present

## 2021-10-15 DIAGNOSIS — R2689 Other abnormalities of gait and mobility: Secondary | ICD-10-CM | POA: Diagnosis not present

## 2021-10-15 DIAGNOSIS — Z471 Aftercare following joint replacement surgery: Secondary | ICD-10-CM | POA: Diagnosis not present

## 2021-10-15 DIAGNOSIS — R600 Localized edema: Secondary | ICD-10-CM | POA: Diagnosis not present

## 2021-10-15 DIAGNOSIS — M1711 Unilateral primary osteoarthritis, right knee: Secondary | ICD-10-CM | POA: Diagnosis not present

## 2021-10-17 DIAGNOSIS — R600 Localized edema: Secondary | ICD-10-CM | POA: Diagnosis not present

## 2021-10-17 DIAGNOSIS — M1711 Unilateral primary osteoarthritis, right knee: Secondary | ICD-10-CM | POA: Diagnosis not present

## 2021-10-17 DIAGNOSIS — M25561 Pain in right knee: Secondary | ICD-10-CM | POA: Diagnosis not present

## 2021-10-17 DIAGNOSIS — R2689 Other abnormalities of gait and mobility: Secondary | ICD-10-CM | POA: Diagnosis not present

## 2021-10-17 DIAGNOSIS — Z471 Aftercare following joint replacement surgery: Secondary | ICD-10-CM | POA: Diagnosis not present

## 2021-10-22 DIAGNOSIS — R6889 Other general symptoms and signs: Secondary | ICD-10-CM | POA: Diagnosis not present

## 2021-10-22 DIAGNOSIS — M25561 Pain in right knee: Secondary | ICD-10-CM | POA: Diagnosis not present

## 2021-10-22 DIAGNOSIS — R600 Localized edema: Secondary | ICD-10-CM | POA: Diagnosis not present

## 2021-10-22 DIAGNOSIS — R2689 Other abnormalities of gait and mobility: Secondary | ICD-10-CM | POA: Diagnosis not present

## 2021-10-22 DIAGNOSIS — Z96651 Presence of right artificial knee joint: Secondary | ICD-10-CM | POA: Diagnosis not present

## 2021-10-22 DIAGNOSIS — Z471 Aftercare following joint replacement surgery: Secondary | ICD-10-CM | POA: Diagnosis not present

## 2021-10-22 DIAGNOSIS — M1711 Unilateral primary osteoarthritis, right knee: Secondary | ICD-10-CM | POA: Diagnosis not present

## 2021-10-22 DIAGNOSIS — G8929 Other chronic pain: Secondary | ICD-10-CM | POA: Diagnosis not present

## 2021-10-24 DIAGNOSIS — R2689 Other abnormalities of gait and mobility: Secondary | ICD-10-CM | POA: Diagnosis not present

## 2021-10-24 DIAGNOSIS — M1711 Unilateral primary osteoarthritis, right knee: Secondary | ICD-10-CM | POA: Diagnosis not present

## 2021-10-24 DIAGNOSIS — Z471 Aftercare following joint replacement surgery: Secondary | ICD-10-CM | POA: Diagnosis not present

## 2021-10-24 DIAGNOSIS — R600 Localized edema: Secondary | ICD-10-CM | POA: Diagnosis not present

## 2021-10-24 DIAGNOSIS — M25561 Pain in right knee: Secondary | ICD-10-CM | POA: Diagnosis not present

## 2021-10-29 DIAGNOSIS — M1711 Unilateral primary osteoarthritis, right knee: Secondary | ICD-10-CM | POA: Diagnosis not present

## 2021-10-29 DIAGNOSIS — R600 Localized edema: Secondary | ICD-10-CM | POA: Diagnosis not present

## 2021-10-29 DIAGNOSIS — M25561 Pain in right knee: Secondary | ICD-10-CM | POA: Diagnosis not present

## 2021-10-29 DIAGNOSIS — Z471 Aftercare following joint replacement surgery: Secondary | ICD-10-CM | POA: Diagnosis not present

## 2021-10-29 DIAGNOSIS — R2689 Other abnormalities of gait and mobility: Secondary | ICD-10-CM | POA: Diagnosis not present

## 2021-11-05 DIAGNOSIS — R600 Localized edema: Secondary | ICD-10-CM | POA: Diagnosis not present

## 2021-11-05 DIAGNOSIS — R2689 Other abnormalities of gait and mobility: Secondary | ICD-10-CM | POA: Diagnosis not present

## 2021-11-05 DIAGNOSIS — Z471 Aftercare following joint replacement surgery: Secondary | ICD-10-CM | POA: Diagnosis not present

## 2021-11-05 DIAGNOSIS — M25561 Pain in right knee: Secondary | ICD-10-CM | POA: Diagnosis not present

## 2021-11-05 DIAGNOSIS — M1711 Unilateral primary osteoarthritis, right knee: Secondary | ICD-10-CM | POA: Diagnosis not present

## 2022-01-02 DIAGNOSIS — Z96651 Presence of right artificial knee joint: Secondary | ICD-10-CM | POA: Diagnosis not present

## 2022-01-02 DIAGNOSIS — Z471 Aftercare following joint replacement surgery: Secondary | ICD-10-CM | POA: Diagnosis not present

## 2022-01-02 DIAGNOSIS — Z9889 Other specified postprocedural states: Secondary | ICD-10-CM | POA: Diagnosis not present

## 2022-02-20 DIAGNOSIS — E890 Postprocedural hypothyroidism: Secondary | ICD-10-CM | POA: Diagnosis not present

## 2022-02-20 DIAGNOSIS — Z0000 Encounter for general adult medical examination without abnormal findings: Secondary | ICD-10-CM | POA: Diagnosis not present

## 2022-02-20 DIAGNOSIS — G4730 Sleep apnea, unspecified: Secondary | ICD-10-CM | POA: Diagnosis not present

## 2022-02-20 DIAGNOSIS — Z23 Encounter for immunization: Secondary | ICD-10-CM | POA: Diagnosis not present

## 2022-02-20 DIAGNOSIS — E039 Hypothyroidism, unspecified: Secondary | ICD-10-CM | POA: Diagnosis not present

## 2022-02-20 DIAGNOSIS — E7800 Pure hypercholesterolemia, unspecified: Secondary | ICD-10-CM | POA: Diagnosis not present

## 2022-02-20 DIAGNOSIS — I10 Essential (primary) hypertension: Secondary | ICD-10-CM | POA: Diagnosis not present

## 2022-02-20 DIAGNOSIS — R7301 Impaired fasting glucose: Secondary | ICD-10-CM | POA: Diagnosis not present

## 2022-03-11 DIAGNOSIS — G5601 Carpal tunnel syndrome, right upper limb: Secondary | ICD-10-CM | POA: Diagnosis not present

## 2022-03-11 DIAGNOSIS — M1811 Unilateral primary osteoarthritis of first carpometacarpal joint, right hand: Secondary | ICD-10-CM | POA: Diagnosis not present

## 2022-04-09 DIAGNOSIS — G5601 Carpal tunnel syndrome, right upper limb: Secondary | ICD-10-CM | POA: Diagnosis not present

## 2022-04-16 DIAGNOSIS — G5601 Carpal tunnel syndrome, right upper limb: Secondary | ICD-10-CM | POA: Diagnosis not present

## 2022-05-30 DIAGNOSIS — R109 Unspecified abdominal pain: Secondary | ICD-10-CM | POA: Diagnosis not present

## 2022-07-07 DIAGNOSIS — G4733 Obstructive sleep apnea (adult) (pediatric): Secondary | ICD-10-CM | POA: Diagnosis not present

## 2022-08-22 DIAGNOSIS — Z9989 Dependence on other enabling machines and devices: Secondary | ICD-10-CM | POA: Diagnosis not present

## 2022-08-22 DIAGNOSIS — I10 Essential (primary) hypertension: Secondary | ICD-10-CM | POA: Diagnosis not present

## 2022-08-22 DIAGNOSIS — G4730 Sleep apnea, unspecified: Secondary | ICD-10-CM | POA: Diagnosis not present

## 2022-08-22 DIAGNOSIS — E890 Postprocedural hypothyroidism: Secondary | ICD-10-CM | POA: Diagnosis not present

## 2022-08-22 DIAGNOSIS — E7800 Pure hypercholesterolemia, unspecified: Secondary | ICD-10-CM | POA: Diagnosis not present

## 2022-08-22 DIAGNOSIS — J309 Allergic rhinitis, unspecified: Secondary | ICD-10-CM | POA: Diagnosis not present

## 2022-08-22 DIAGNOSIS — R7301 Impaired fasting glucose: Secondary | ICD-10-CM | POA: Diagnosis not present

## 2022-08-22 DIAGNOSIS — M179 Osteoarthritis of knee, unspecified: Secondary | ICD-10-CM | POA: Diagnosis not present

## 2022-08-26 DIAGNOSIS — M1712 Unilateral primary osteoarthritis, left knee: Secondary | ICD-10-CM | POA: Diagnosis not present

## 2022-09-03 DIAGNOSIS — M1712 Unilateral primary osteoarthritis, left knee: Secondary | ICD-10-CM | POA: Diagnosis not present

## 2022-09-03 DIAGNOSIS — M25562 Pain in left knee: Secondary | ICD-10-CM | POA: Diagnosis not present

## 2022-09-11 DIAGNOSIS — L821 Other seborrheic keratosis: Secondary | ICD-10-CM | POA: Diagnosis not present

## 2022-09-11 DIAGNOSIS — Z85828 Personal history of other malignant neoplasm of skin: Secondary | ICD-10-CM | POA: Diagnosis not present

## 2022-09-11 DIAGNOSIS — Z129 Encounter for screening for malignant neoplasm, site unspecified: Secondary | ICD-10-CM | POA: Diagnosis not present

## 2022-09-11 DIAGNOSIS — L570 Actinic keratosis: Secondary | ICD-10-CM | POA: Diagnosis not present

## 2022-09-12 DIAGNOSIS — Z96652 Presence of left artificial knee joint: Secondary | ICD-10-CM | POA: Diagnosis not present

## 2022-09-12 DIAGNOSIS — G8918 Other acute postprocedural pain: Secondary | ICD-10-CM | POA: Diagnosis not present

## 2022-09-12 DIAGNOSIS — M1712 Unilateral primary osteoarthritis, left knee: Secondary | ICD-10-CM | POA: Diagnosis not present

## 2022-09-12 DIAGNOSIS — M21162 Varus deformity, not elsewhere classified, left knee: Secondary | ICD-10-CM | POA: Diagnosis not present

## 2022-09-15 DIAGNOSIS — Z96652 Presence of left artificial knee joint: Secondary | ICD-10-CM | POA: Diagnosis not present

## 2022-09-17 DIAGNOSIS — Z96652 Presence of left artificial knee joint: Secondary | ICD-10-CM | POA: Diagnosis not present

## 2022-09-22 ENCOUNTER — Other Ambulatory Visit: Payer: Self-pay

## 2022-09-22 ENCOUNTER — Ambulatory Visit
Admission: RE | Admit: 2022-09-22 | Discharge: 2022-09-22 | Disposition: A | Discharge status: Home / Self Care | Payer: Medicare Other | Source: Ambulatory Visit | Attending: Family Medicine | Admitting: Family Medicine

## 2022-09-22 ENCOUNTER — Other Ambulatory Visit: Payer: Self-pay | Admitting: Family Medicine

## 2022-09-22 DIAGNOSIS — I700 Atherosclerosis of aorta: Secondary | ICD-10-CM | POA: Diagnosis not present

## 2022-09-22 DIAGNOSIS — R109 Unspecified abdominal pain: Secondary | ICD-10-CM

## 2022-09-22 DIAGNOSIS — R3129 Other microscopic hematuria: Secondary | ICD-10-CM | POA: Diagnosis not present

## 2022-09-22 DIAGNOSIS — R319 Hematuria, unspecified: Secondary | ICD-10-CM | POA: Diagnosis not present

## 2022-09-22 DIAGNOSIS — N23 Unspecified renal colic: Secondary | ICD-10-CM | POA: Diagnosis not present

## 2022-09-22 MED ORDER — OXYCODONE HCL 5 MG PO TABS
5.0000 mg | ORAL_TABLET | Freq: Four times a day (QID) | ORAL | 0 refills | Status: AC | PRN
  Filled 2022-09-22: qty 25, 7d supply, fill #0

## 2022-09-22 MED ORDER — AMLODIPINE BESYLATE 2.5 MG PO TABS
2.5000 mg | ORAL_TABLET | Freq: Every day | ORAL | 0 refills | Status: AC
  Filled 2022-09-22: qty 30, 30d supply, fill #0

## 2022-09-22 MED ORDER — TAMSULOSIN HCL 0.4 MG PO CAPS
0.4000 mg | ORAL_CAPSULE | Freq: Every day | ORAL | 0 refills | Status: AC
  Filled 2022-09-22: qty 30, 30d supply, fill #0

## 2022-09-23 DIAGNOSIS — M1712 Unilateral primary osteoarthritis, left knee: Secondary | ICD-10-CM | POA: Diagnosis not present

## 2022-09-24 DIAGNOSIS — K5900 Constipation, unspecified: Secondary | ICD-10-CM | POA: Diagnosis not present

## 2022-09-24 DIAGNOSIS — K429 Umbilical hernia without obstruction or gangrene: Secondary | ICD-10-CM | POA: Diagnosis not present

## 2022-09-24 DIAGNOSIS — N210 Calculus in bladder: Secondary | ICD-10-CM | POA: Diagnosis not present

## 2022-09-24 DIAGNOSIS — N201 Calculus of ureter: Secondary | ICD-10-CM | POA: Diagnosis not present

## 2022-09-24 DIAGNOSIS — R109 Unspecified abdominal pain: Secondary | ICD-10-CM | POA: Diagnosis not present

## 2022-09-25 DIAGNOSIS — K760 Fatty (change of) liver, not elsewhere classified: Secondary | ICD-10-CM | POA: Diagnosis not present

## 2022-09-25 DIAGNOSIS — K4020 Bilateral inguinal hernia, without obstruction or gangrene, not specified as recurrent: Secondary | ICD-10-CM | POA: Diagnosis not present

## 2022-09-25 DIAGNOSIS — K439 Ventral hernia without obstruction or gangrene: Secondary | ICD-10-CM | POA: Diagnosis not present

## 2022-09-26 DIAGNOSIS — Z96652 Presence of left artificial knee joint: Secondary | ICD-10-CM | POA: Diagnosis not present

## 2022-09-29 DIAGNOSIS — Z96652 Presence of left artificial knee joint: Secondary | ICD-10-CM | POA: Diagnosis not present

## 2022-09-30 DIAGNOSIS — Z87442 Personal history of urinary calculi: Secondary | ICD-10-CM | POA: Diagnosis not present

## 2022-09-30 DIAGNOSIS — N200 Calculus of kidney: Secondary | ICD-10-CM | POA: Diagnosis not present

## 2022-10-01 DIAGNOSIS — Z96652 Presence of left artificial knee joint: Secondary | ICD-10-CM | POA: Diagnosis not present

## 2022-10-07 DIAGNOSIS — Z96652 Presence of left artificial knee joint: Secondary | ICD-10-CM | POA: Diagnosis not present

## 2022-10-09 DIAGNOSIS — Z96652 Presence of left artificial knee joint: Secondary | ICD-10-CM | POA: Diagnosis not present

## 2022-10-13 DIAGNOSIS — Z96652 Presence of left artificial knee joint: Secondary | ICD-10-CM | POA: Diagnosis not present

## 2022-10-27 DIAGNOSIS — Z96652 Presence of left artificial knee joint: Secondary | ICD-10-CM | POA: Diagnosis not present

## 2022-11-05 DIAGNOSIS — G4733 Obstructive sleep apnea (adult) (pediatric): Secondary | ICD-10-CM | POA: Diagnosis not present

## 2022-11-05 DIAGNOSIS — K4020 Bilateral inguinal hernia, without obstruction or gangrene, not specified as recurrent: Secondary | ICD-10-CM | POA: Diagnosis not present

## 2022-11-05 DIAGNOSIS — K439 Ventral hernia without obstruction or gangrene: Secondary | ICD-10-CM | POA: Diagnosis not present

## 2022-11-05 DIAGNOSIS — Z881 Allergy status to other antibiotic agents status: Secondary | ICD-10-CM | POA: Diagnosis not present

## 2022-11-05 DIAGNOSIS — N200 Calculus of kidney: Secondary | ICD-10-CM | POA: Diagnosis not present

## 2022-11-05 DIAGNOSIS — M6208 Separation of muscle (nontraumatic), other site: Secondary | ICD-10-CM | POA: Diagnosis not present

## 2022-11-05 DIAGNOSIS — G4730 Sleep apnea, unspecified: Secondary | ICD-10-CM | POA: Diagnosis not present

## 2022-11-05 DIAGNOSIS — Z79899 Other long term (current) drug therapy: Secondary | ICD-10-CM | POA: Diagnosis not present

## 2022-11-05 DIAGNOSIS — I10 Essential (primary) hypertension: Secondary | ICD-10-CM | POA: Diagnosis not present

## 2022-11-05 DIAGNOSIS — Z888 Allergy status to other drugs, medicaments and biological substances status: Secondary | ICD-10-CM | POA: Diagnosis not present

## 2022-11-05 DIAGNOSIS — Z791 Long term (current) use of non-steroidal anti-inflammatories (NSAID): Secondary | ICD-10-CM | POA: Diagnosis not present

## 2022-11-05 DIAGNOSIS — K4000 Bilateral inguinal hernia, with obstruction, without gangrene, not specified as recurrent: Secondary | ICD-10-CM | POA: Diagnosis not present

## 2022-11-05 DIAGNOSIS — Z87891 Personal history of nicotine dependence: Secondary | ICD-10-CM | POA: Diagnosis not present

## 2022-11-05 DIAGNOSIS — E039 Hypothyroidism, unspecified: Secondary | ICD-10-CM | POA: Diagnosis not present

## 2022-11-18 DIAGNOSIS — L570 Actinic keratosis: Secondary | ICD-10-CM | POA: Diagnosis not present

## 2022-11-18 DIAGNOSIS — L821 Other seborrheic keratosis: Secondary | ICD-10-CM | POA: Diagnosis not present

## 2022-12-18 DIAGNOSIS — E785 Hyperlipidemia, unspecified: Secondary | ICD-10-CM | POA: Diagnosis not present

## 2022-12-18 DIAGNOSIS — N138 Other obstructive and reflux uropathy: Secondary | ICD-10-CM | POA: Diagnosis not present

## 2022-12-18 DIAGNOSIS — R7303 Prediabetes: Secondary | ICD-10-CM | POA: Diagnosis not present

## 2022-12-18 DIAGNOSIS — Z0000 Encounter for general adult medical examination without abnormal findings: Secondary | ICD-10-CM | POA: Diagnosis not present

## 2022-12-18 DIAGNOSIS — I10 Essential (primary) hypertension: Secondary | ICD-10-CM | POA: Diagnosis not present

## 2022-12-18 DIAGNOSIS — E039 Hypothyroidism, unspecified: Secondary | ICD-10-CM | POA: Diagnosis not present

## 2023-03-18 DIAGNOSIS — Z87891 Personal history of nicotine dependence: Secondary | ICD-10-CM | POA: Diagnosis not present

## 2023-03-18 DIAGNOSIS — I2510 Atherosclerotic heart disease of native coronary artery without angina pectoris: Secondary | ICD-10-CM | POA: Diagnosis not present

## 2023-03-18 DIAGNOSIS — R7989 Other specified abnormal findings of blood chemistry: Secondary | ICD-10-CM | POA: Diagnosis not present

## 2023-03-18 DIAGNOSIS — E039 Hypothyroidism, unspecified: Secondary | ICD-10-CM | POA: Diagnosis not present

## 2023-03-18 DIAGNOSIS — Z136 Encounter for screening for cardiovascular disorders: Secondary | ICD-10-CM | POA: Diagnosis not present

## 2023-03-18 DIAGNOSIS — Z0000 Encounter for general adult medical examination without abnormal findings: Secondary | ICD-10-CM | POA: Diagnosis not present

## 2023-04-06 DIAGNOSIS — Z87891 Personal history of nicotine dependence: Secondary | ICD-10-CM | POA: Diagnosis not present

## 2023-04-06 DIAGNOSIS — Z136 Encounter for screening for cardiovascular disorders: Secondary | ICD-10-CM | POA: Diagnosis not present

## 2023-04-06 DIAGNOSIS — I2510 Atherosclerotic heart disease of native coronary artery without angina pectoris: Secondary | ICD-10-CM | POA: Diagnosis not present
